# Patient Record
Sex: Female | Born: 1960 | Race: White | Hispanic: No | Marital: Single | Smoking: Never smoker
Health system: Southern US, Community
[De-identification: ages and names within clinical notes are randomized; demographics above are authoritative.]

## PROBLEM LIST (undated history)

## (undated) DIAGNOSIS — K5792 Diverticulitis of intestine, part unspecified, without perforation or abscess without bleeding: Secondary | ICD-10-CM

## (undated) DIAGNOSIS — C801 Malignant (primary) neoplasm, unspecified: Secondary | ICD-10-CM

## (undated) HISTORY — PX: CHOLECYSTECTOMY: SHX55

## (undated) HISTORY — PX: APPENDECTOMY: SHX54

## (undated) HISTORY — PX: TUBAL LIGATION: SHX77

## (undated) HISTORY — PX: ABDOMINAL SURGERY: SHX537

---

## 1998-04-27 ENCOUNTER — Other Ambulatory Visit: Admission: RE | Admit: 1998-04-27 | Discharge: 1998-04-27 | Payer: Self-pay | Admitting: Obstetrics and Gynecology

## 1998-07-19 ENCOUNTER — Ambulatory Visit (HOSPITAL_COMMUNITY): Admission: RE | Admit: 1998-07-19 | Discharge: 1998-07-19 | Payer: Self-pay | Admitting: Infectious Diseases

## 1998-07-19 ENCOUNTER — Encounter: Payer: Self-pay | Admitting: Infectious Diseases

## 1999-06-20 ENCOUNTER — Other Ambulatory Visit: Admission: RE | Admit: 1999-06-20 | Discharge: 1999-06-20 | Payer: Self-pay | Admitting: Obstetrics and Gynecology

## 1999-08-18 ENCOUNTER — Encounter: Admission: RE | Admit: 1999-08-18 | Discharge: 1999-11-16 | Payer: Self-pay | Admitting: *Deleted

## 1999-09-14 ENCOUNTER — Other Ambulatory Visit: Admission: RE | Admit: 1999-09-14 | Discharge: 1999-09-14 | Payer: Self-pay | Admitting: Obstetrics and Gynecology

## 1999-11-03 ENCOUNTER — Encounter: Payer: Self-pay | Admitting: Obstetrics and Gynecology

## 1999-11-03 ENCOUNTER — Encounter: Admission: RE | Admit: 1999-11-03 | Discharge: 1999-11-03 | Payer: Self-pay | Admitting: Obstetrics and Gynecology

## 2000-08-03 ENCOUNTER — Encounter: Admission: RE | Admit: 2000-08-03 | Discharge: 2000-08-03 | Payer: Self-pay | Admitting: Obstetrics and Gynecology

## 2000-08-03 ENCOUNTER — Encounter: Payer: Self-pay | Admitting: Obstetrics and Gynecology

## 2000-09-17 ENCOUNTER — Other Ambulatory Visit: Admission: RE | Admit: 2000-09-17 | Discharge: 2000-09-17 | Payer: Self-pay | Admitting: Obstetrics and Gynecology

## 2001-09-30 ENCOUNTER — Other Ambulatory Visit: Admission: RE | Admit: 2001-09-30 | Discharge: 2001-09-30 | Payer: Self-pay | Admitting: Obstetrics and Gynecology

## 2002-11-13 ENCOUNTER — Other Ambulatory Visit: Admission: RE | Admit: 2002-11-13 | Discharge: 2002-11-13 | Payer: Self-pay | Admitting: Obstetrics and Gynecology

## 2002-12-12 ENCOUNTER — Encounter: Admission: RE | Admit: 2002-12-12 | Discharge: 2002-12-12 | Payer: Self-pay | Admitting: Obstetrics and Gynecology

## 2002-12-12 ENCOUNTER — Encounter: Payer: Self-pay | Admitting: Obstetrics and Gynecology

## 2003-03-21 ENCOUNTER — Emergency Department (HOSPITAL_COMMUNITY): Admission: EM | Admit: 2003-03-21 | Discharge: 2003-03-21 | Payer: Self-pay | Admitting: Emergency Medicine

## 2003-03-21 ENCOUNTER — Encounter: Payer: Self-pay | Admitting: Emergency Medicine

## 2003-03-26 ENCOUNTER — Encounter: Admission: RE | Admit: 2003-03-26 | Discharge: 2003-03-26 | Payer: Self-pay | Admitting: Gastroenterology

## 2003-03-26 ENCOUNTER — Encounter: Payer: Self-pay | Admitting: Gastroenterology

## 2003-12-23 ENCOUNTER — Other Ambulatory Visit: Admission: RE | Admit: 2003-12-23 | Discharge: 2003-12-23 | Payer: Self-pay | Admitting: Obstetrics and Gynecology

## 2004-01-01 ENCOUNTER — Ambulatory Visit (HOSPITAL_COMMUNITY): Admission: RE | Admit: 2004-01-01 | Discharge: 2004-01-01 | Payer: Self-pay | Admitting: Obstetrics and Gynecology

## 2004-01-13 ENCOUNTER — Encounter: Admission: RE | Admit: 2004-01-13 | Discharge: 2004-04-12 | Payer: Self-pay | Admitting: Internal Medicine

## 2004-04-13 ENCOUNTER — Encounter: Admission: RE | Admit: 2004-04-13 | Discharge: 2004-07-12 | Payer: Self-pay | Admitting: Internal Medicine

## 2004-06-14 ENCOUNTER — Ambulatory Visit (HOSPITAL_COMMUNITY): Admission: RE | Admit: 2004-06-14 | Discharge: 2004-06-14 | Payer: Self-pay | Admitting: Internal Medicine

## 2004-08-02 ENCOUNTER — Observation Stay (HOSPITAL_COMMUNITY): Admission: EM | Admit: 2004-08-02 | Discharge: 2004-08-03 | Payer: Self-pay | Admitting: Emergency Medicine

## 2004-08-02 ENCOUNTER — Encounter: Payer: Self-pay | Admitting: Obstetrics and Gynecology

## 2004-08-02 ENCOUNTER — Encounter (INDEPENDENT_AMBULATORY_CARE_PROVIDER_SITE_OTHER): Payer: Self-pay | Admitting: Specialist

## 2005-01-25 ENCOUNTER — Encounter: Admission: RE | Admit: 2005-01-25 | Discharge: 2005-01-25 | Payer: Self-pay | Admitting: Obstetrics and Gynecology

## 2005-05-29 ENCOUNTER — Encounter: Admission: RE | Admit: 2005-05-29 | Discharge: 2005-05-29 | Payer: Self-pay | Admitting: Family Medicine

## 2005-06-02 ENCOUNTER — Encounter: Admission: RE | Admit: 2005-06-02 | Discharge: 2005-06-02 | Payer: Self-pay | Admitting: Gastroenterology

## 2005-06-26 ENCOUNTER — Encounter: Admission: RE | Admit: 2005-06-26 | Discharge: 2005-06-26 | Payer: Self-pay | Admitting: Internal Medicine

## 2005-09-12 ENCOUNTER — Emergency Department (HOSPITAL_COMMUNITY): Admission: EM | Admit: 2005-09-12 | Discharge: 2005-09-12 | Payer: Self-pay | Admitting: Emergency Medicine

## 2005-10-15 ENCOUNTER — Encounter: Admission: RE | Admit: 2005-10-15 | Discharge: 2005-10-15 | Payer: Self-pay | Admitting: Internal Medicine

## 2006-02-02 ENCOUNTER — Encounter: Admission: RE | Admit: 2006-02-02 | Discharge: 2006-02-02 | Payer: Self-pay | Admitting: Internal Medicine

## 2006-02-02 IMAGING — US US ABDOMEN COMPLETE
1 series · 14 of 25 positions shown · non-contrast
Comparison: [REDACTED] abdominal ultrasound [DATE] and [REDACTED] abdominal CT [DATE].

CLINICAL DATA: Right-sided abdominal pain. 
 ABDOMEN ULTRASOUND:
TECHNIQUE: Complete abdominal ultrasound examination was performed including evaluation of the liver, gallbladder, bile ducts, pancreas, kidneys, spleen, IVC, and abdominal aorta.

[Series 1: unknown · 0.38mm/px · 14 of 82 slices shown]
[im 1/82]
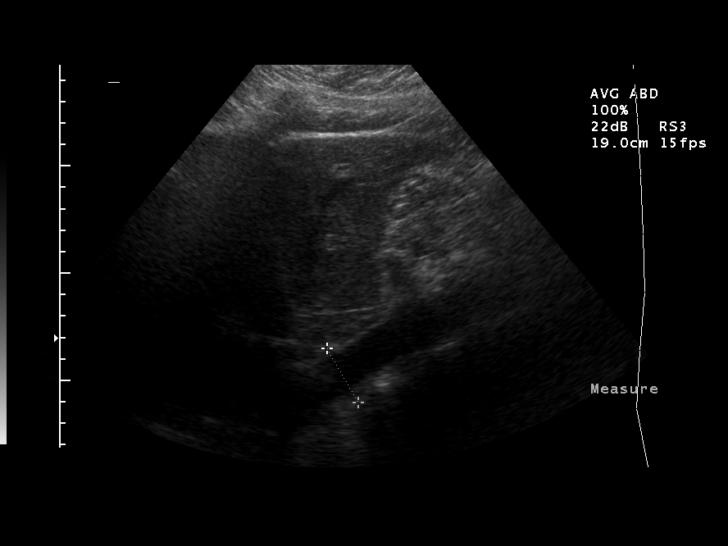
[im 7/82]
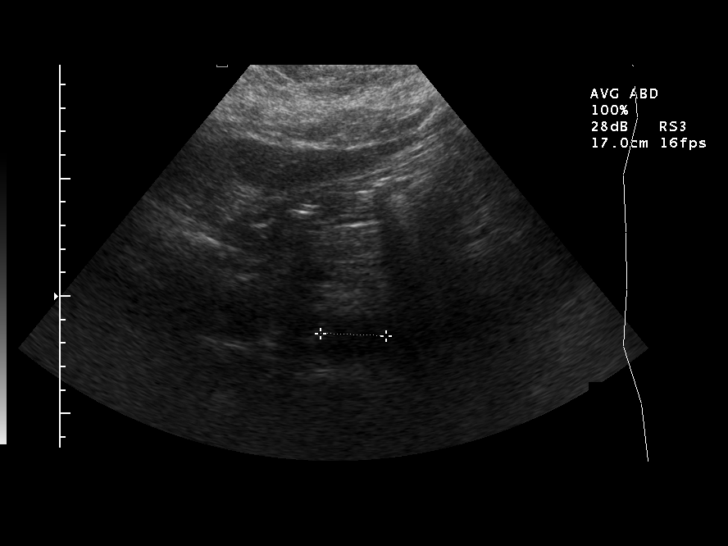
[im 14/82]
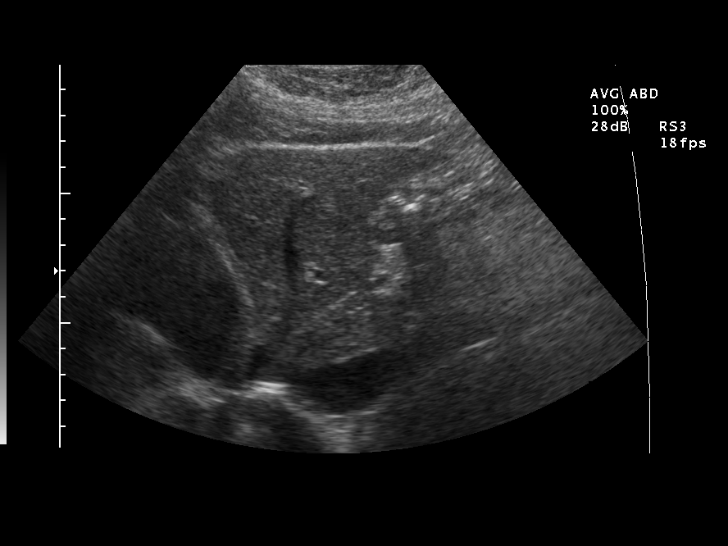
[im 21/82]
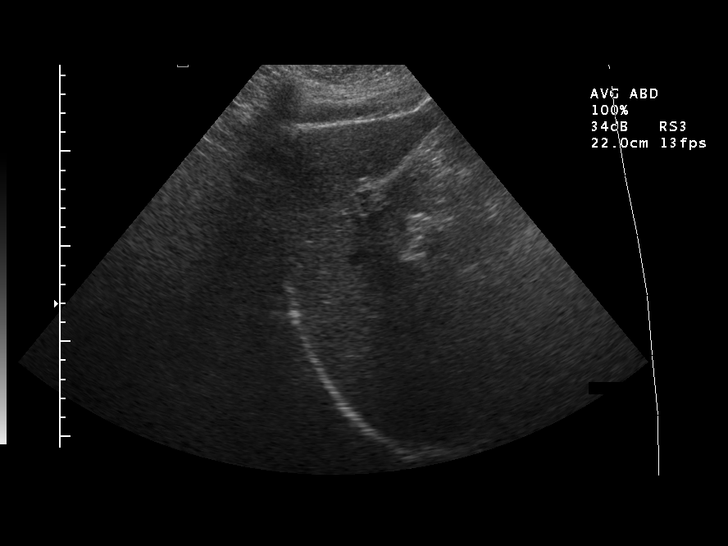
[im 28/82]
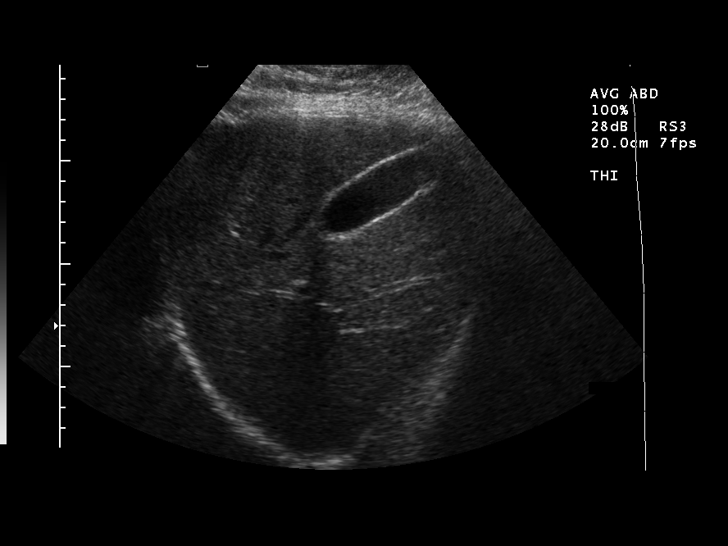
[im 31/82]
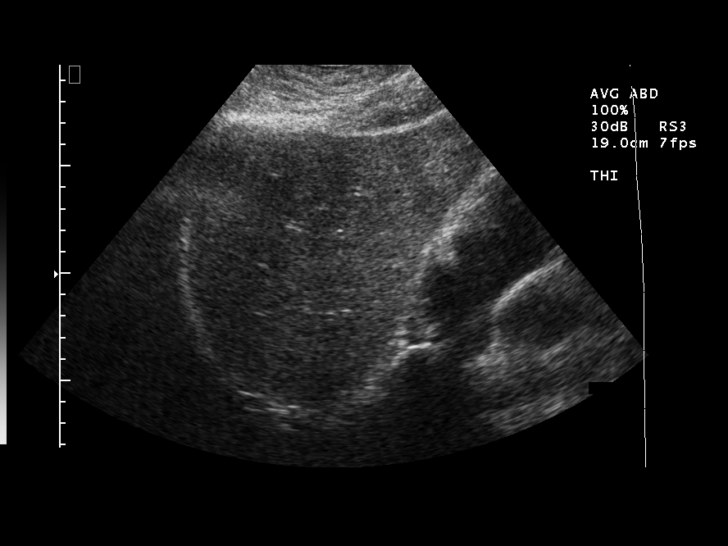
[im 38/82]
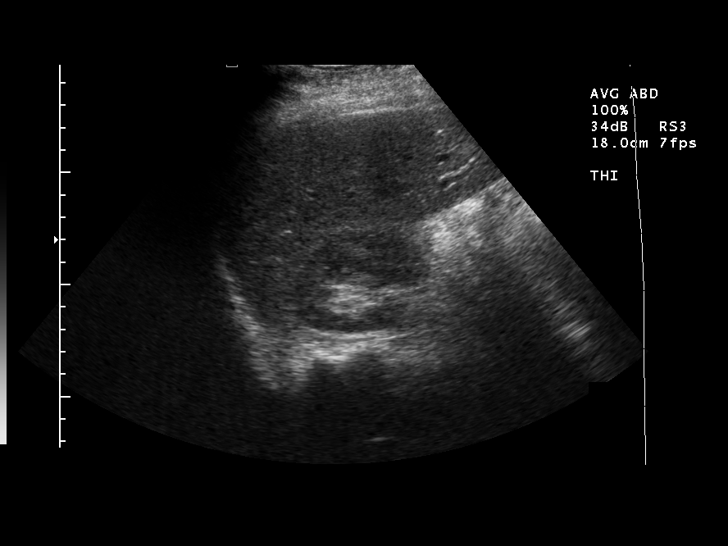
[im 44/82]
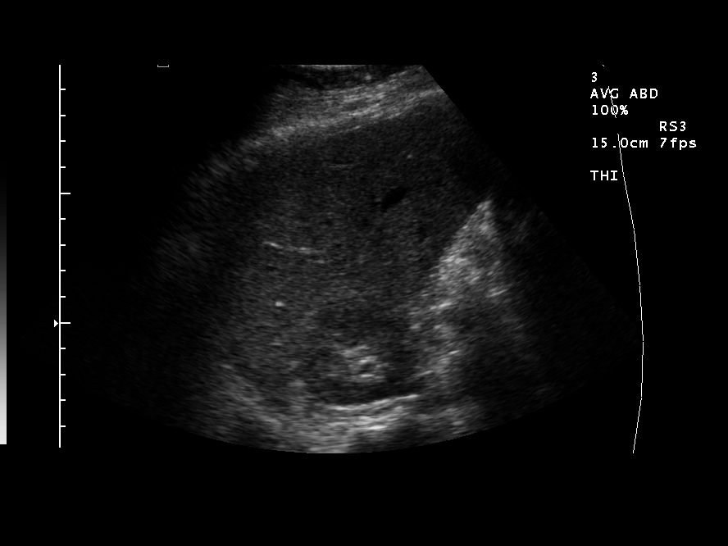
[im 51/82]
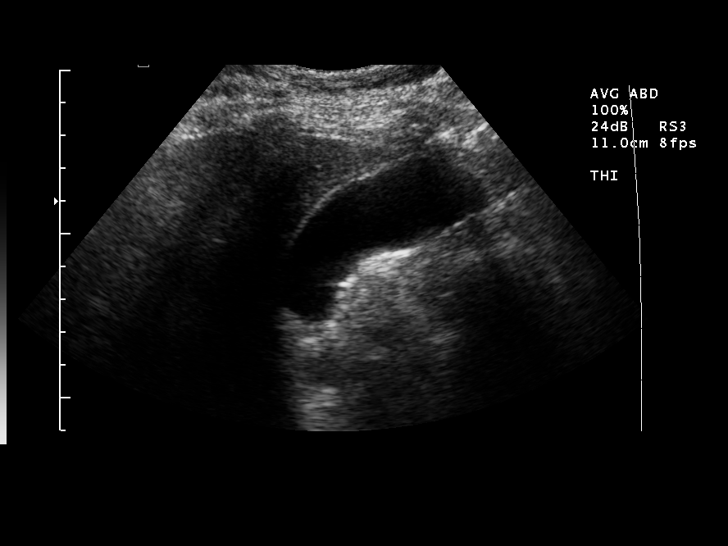
[im 55/82]
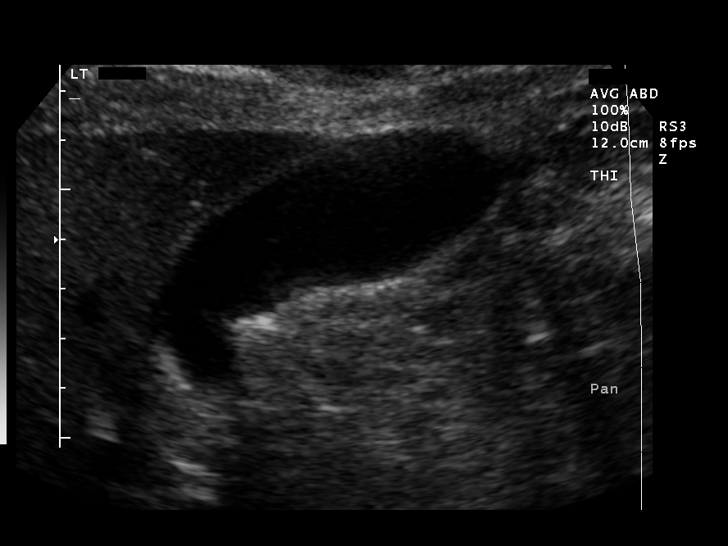
[im 61/82]
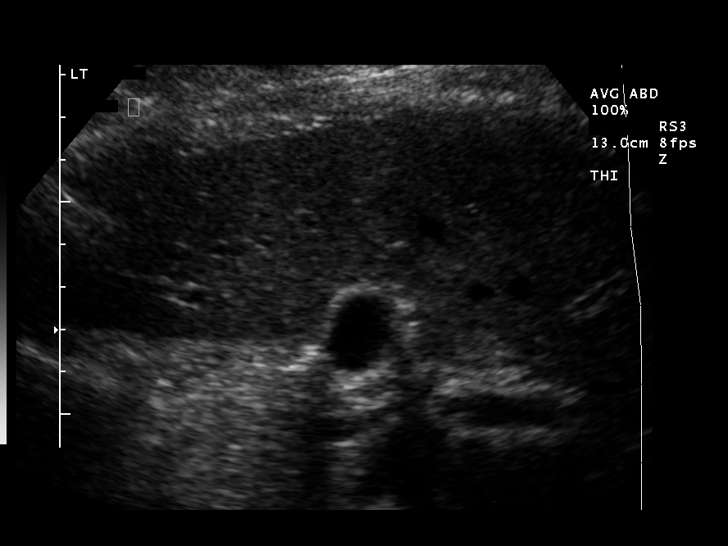
[im 68/82]
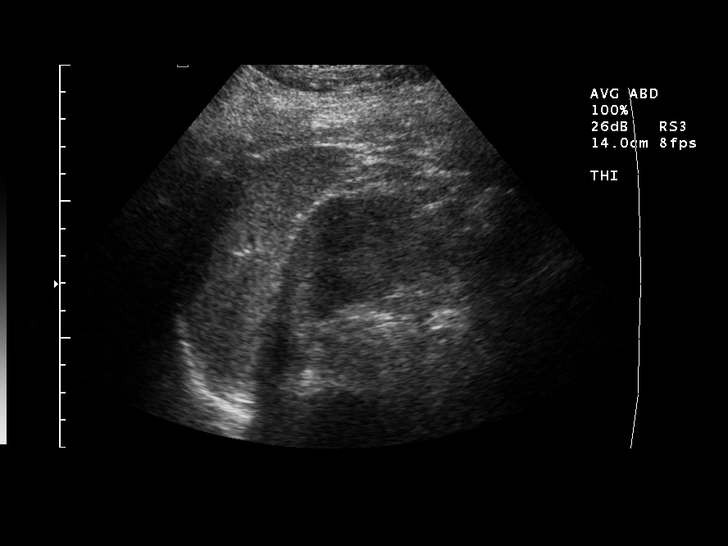
[im 75/82]
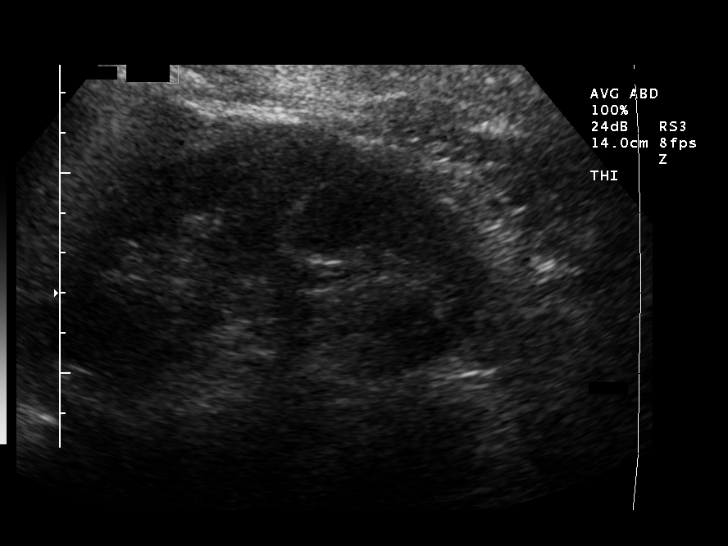
[im 82/82]
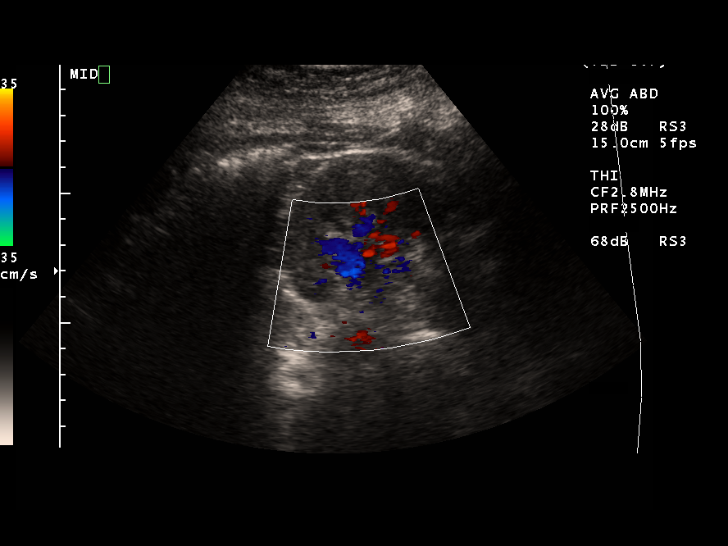

[14 of 25 positions shown; findings below may reference images not displayed]

FINDINGS: There is no evidence of gallstones or biliary ductal dilatation.  The liver is within normal limits in echogenicity, and no focal liver lesions are seen.   The visualized portions of the IVC and pancreas are unremarkable.  Pancreatic assessment is limited due to overlying gastrointestinal gas.  
 There is no evidence of splenomegaly.  The kidneys are unremarkable, and there is no evidence of hydronephrosis.  The abdominal aorta is non-dilated.  Gallbladder wall thickness 2 mm, maximum common bile duct diameter 5 mm, spleen 10 cm long, right kidney 11.7 cm long, left kidney 11.3 cm long, maximum abdominal aortic diameter is 3 cm proximally.
IMPRESSION: 1.  Limited assessment of pancreas.
 2.  Otherwise normal.

## 2006-02-09 ENCOUNTER — Encounter: Admission: RE | Admit: 2006-02-09 | Discharge: 2006-02-09 | Payer: Self-pay | Admitting: Gastroenterology

## 2006-02-14 ENCOUNTER — Ambulatory Visit (HOSPITAL_COMMUNITY): Admission: RE | Admit: 2006-02-14 | Discharge: 2006-02-14 | Payer: Self-pay

## 2006-02-14 ENCOUNTER — Encounter (INDEPENDENT_AMBULATORY_CARE_PROVIDER_SITE_OTHER): Payer: Self-pay | Admitting: Specialist

## 2006-08-01 ENCOUNTER — Encounter: Admission: RE | Admit: 2006-08-01 | Discharge: 2006-08-01 | Payer: Self-pay | Admitting: Internal Medicine

## 2007-03-21 ENCOUNTER — Emergency Department (HOSPITAL_COMMUNITY): Admission: EM | Admit: 2007-03-21 | Discharge: 2007-03-21 | Payer: Self-pay | Admitting: Emergency Medicine

## 2007-04-16 ENCOUNTER — Encounter: Admission: RE | Admit: 2007-04-16 | Discharge: 2007-04-16 | Payer: Self-pay | Admitting: Sports Medicine

## 2007-05-06 ENCOUNTER — Encounter: Admission: RE | Admit: 2007-05-06 | Discharge: 2007-05-06 | Payer: Self-pay | Admitting: Sports Medicine

## 2007-05-20 ENCOUNTER — Encounter: Admission: RE | Admit: 2007-05-20 | Discharge: 2007-05-20 | Payer: Self-pay | Admitting: Sports Medicine

## 2007-08-14 ENCOUNTER — Encounter: Admission: RE | Admit: 2007-08-14 | Discharge: 2007-08-14 | Payer: Self-pay | Admitting: Optometry

## 2007-10-18 ENCOUNTER — Emergency Department (HOSPITAL_COMMUNITY): Admission: EM | Admit: 2007-10-18 | Discharge: 2007-10-18 | Payer: Self-pay | Admitting: *Deleted

## 2007-11-14 ENCOUNTER — Encounter: Admission: RE | Admit: 2007-11-14 | Discharge: 2007-11-14 | Payer: Self-pay | Admitting: Neurosurgery

## 2008-01-03 ENCOUNTER — Emergency Department (HOSPITAL_COMMUNITY): Admission: EM | Admit: 2008-01-03 | Discharge: 2008-01-03 | Payer: Self-pay | Admitting: Family Medicine

## 2008-01-09 ENCOUNTER — Other Ambulatory Visit: Admission: RE | Admit: 2008-01-09 | Discharge: 2008-01-09 | Payer: Self-pay | Admitting: Family Medicine

## 2011-03-17 NOTE — Op Note (Signed)
[name], [name]               ACCOUNT NO.:  [id number]   MEDICAL RECORD NO.:  [id number]          PATIENT TYPE:  AMB   LOCATION:  DAY                          FACILITY:  [facility]   PHYSICIAN:  Lebron Conners, M.D.   DATE OF BIRTH:  Apr 12, 1961   DATE OF PROCEDURE:  02/14/2006  DATE OF DISCHARGE:                                 OPERATIVE REPORT   PREOPERATIVE DIAGNOSIS:  Abdominal pain thought to be due to biliary  dyskinesia.   POSTOPERATIVE DIAGNOSIS:  Abdominal pain thought to be due to biliary  dyskinesia.   OPERATION:  Laparoscopic cholecystectomy with operative cholangiogram.   SURGEON:  Lebron Conners, M.D.   ANESTHESIA:  General.   COMPLICATIONS:  None.   BLOOD LOSS:  Minimal.   SPECIMEN:  Gallbladder.   CONDITION:  To PACU, good.   PROCEDURE:  After the patient was monitored and had general anesthesia and  routine preparation and draping of the abdomen, I infiltrated local  anesthetic just below the umbilicus, then used a previous scar to make a  short transverse incision and dissected down through the scar and fat to the  midline fascia, which I incised for about 2 cm, then bluntly entered the  peritoneal cavity and assured that it was an atraumatic entry.  I placed a 0  Vicryl pursestring suture in the fascia and secured a Hasson cannula and  inflated the abdomen with carbon dioxide.  Laparoscopy disclosed no  abnormalities.  The gallbladder was decompressed and appeared non-inflamed,  although there were a few adhesions to the area of the infundibulum.  All  other viscera appeared normal.  I then anesthetized 3 additional sites and  placed a 10-mm epigastric port and two 5-mm right mid-abdominal ports and  retracted the fundus of the gallbladder toward the right shoulder.  With the  patient head-up, foot-down and tilted to the left, I dissected away the  adhesions and then pulled the infundibulum laterally and dissected out the  cystic duct and the cystic  artery.  I placed 3 clips on the cystic artery  and divided it between the 2 closest to the gallbladder.  I placed 1 clip on  the proximal part of the cystic duct as it emerged from the infundibulum.  I  then made a small hole in the cystic duct and cannulated that with a Cook  cholangiogram catheter, which inserted through a small stab incision.  I  secured that with a clip and then with portable fluoroscope, I performed a  fluoroscopic cholangiogram, which showed a normal-sized ducts with rapid  drainage into the duodenum, no filling defects and no evidence of stricture.  I then resumed laparoscopy and removed the clip and the cholangiogram  catheter, then clipped the distal cystic duct with 2 clips and divided it.  I used the hook dissector with cautery to dissect the gallbladder from the  liver, finding 1 other small structure which I thought might be an accessory  duct going directly into the gallbladder or small vessel and I clipped and  divided that.  I then removed the gallbladder from the abdomen  after  detaching it from the liver, after placing it in a plastic pouch.  I then  irrigated the gallbladder fossa and the right upper quadrant and removed the  irrigant.  I saw no evidence of bleeding or leakage of bile.  Sponge, needle  and instrument counts were correct.  After I had tied  the pursestring suture, I felt was umbilical incision closure was secure.  I  removed the 2 lateral ports under direct view, then allowed the carbon  dioxide to escape and removed the epigastric port.  I closed all the skin  incisions with intracuticular 4-0 Vicryl and Steri-Strips and applied  bandages.      Lebron Conners, M.D.  Electronically Signed     WB/MEDQ  D:  02/14/2006  T:  02/15/2006  Job:  [id number]   cc:   Everardo All. Madilyn Fireman, M.D.  Fax: [phone]   Georgann Housekeeper, MD  Fax: [phone]

## 2011-03-17 NOTE — Op Note (Signed)
NAMEMERRELL, RETTINGER               ACCOUNT NO.:  [id number]   MEDICAL RECORD NO.:  [id number]          PATIENT TYPE:  OBV   LOCATION:  0109                         FACILITY:  [facility]   PHYSICIAN:  Lorre Munroe., M.D.DATE OF BIRTH:  Jan 21, 1961   DATE OF PROCEDURE:  08/02/2004  DATE OF DISCHARGE:                                 OPERATIVE REPORT   PREOPERATIVE DIAGNOSIS:  Acute appendicitis.   POSTOPERATIVE DIAGNOSIS:  Acute appendicitis.   OPERATION:  Laparoscopic appendectomy.   SURGEON:  Lebron Conners, M.D.   ANESTHESIA:  General.   PROCEDURE:  After the patient was monitored, anesthesia, and with Foley  catheter in bladder and routine preparation and draping of the abdomen, I  infiltrated local anesthetic just below the umbilicus, a spot below the  midline and had another spot in the right upper quadrant.  I made a short  transverse incision just below the umbilicus and dissected down to the  midline fascia and opened it longitudinally for about 1.5 cm and then opened  the peritoneum bluntly.  I placed a 0 Vicryl purse-string suture in the  fascia and secured a Hasson cannula and then put in a scope and saw evidence  of inflammation in the right lower quadrant.  I saw a little bit of cloudy  fluid in the pelvis but no other abnormalities.  The liver and gallbladder  and all of the intestines appeared normal except for the area of the cecum.  I put in a 5 mm right upper quadrant port and a 12 mm lower midline port  under direct vision, and it appeared to go in atraumatically.  I then  grasped the cecum with a Glassman clamp and bluntly dissected the acute  adhesions between the inflamed appendix and the cecum and lateral pelvic  wall on the right side until I had a view of the appendix, which appeared to  be acutely inflamed at its mid portion, perhaps frankly gangrenous.  Then I  grasped the appendix with a ratchet grasper and elevated it and dissected  further until I  thinned the mesentery out a good bit and had it separated  from the cecum and distal ileum to the point I could staple across it.  I  stapled the appendix and mesentery with one firing of an endoscopic cutting  stapler and that amputated it nicely.  I then reserved it in a plastic pouch  in the right upper quadrant while I irrigated the operative area and removed  the cloudy fluid from the pelvis.  I then removed the appendix from the  abdomen in the plastic pouch through the umbilical incision and tied the  purse-string suture.  I checked and saw that hemostasis was good.  I removed  the right upper  quadrant port under direct vision and saw no bleeding from the abdominal  wall.  After allowing the CO2 to escape, I removed the lower midline port.  I closed all of the skin incisions with intracuticular 4-0 Vicryl and Steri-  Strips.  Patient tolerated the operation well and left the OR in good  condition.  WB/MEDQ  D:  08/02/2004  T:  08/02/2004  Job:  [id number]   cc:   Richardean Sale, M.D.  [address]  [place]  [place] [address]  Fax: [phone]

## 2011-03-17 NOTE — H&P (Signed)
[name], [name]               ACCOUNT NO.:  [id number]   MEDICAL RECORD NO.:  [id number]          PATIENT TYPE:  OBV   LOCATION:  0109                         FACILITY:  [facility]   PHYSICIAN:  Lorre Munroe., M.D.DATE OF BIRTH:  [birth year]   DATE OF ADMISSION:  08/02/2004  DATE OF DISCHARGE:                                HISTORY & PHYSICAL   CHIEF COMPLAINT:  Abdominal pain.   HISTORY OF PRESENT ILLNESS:  This is a previously healthy 50 year old white  female who has had about a one day history of abdominal upset and subsequent  rather severe right lower quadrant pain. White count was elevated. CT scan  showed evidence of acute appendicitis without rupture. She has not had a  fever, but she has felt a chill. She vomited once. No urinary problems. No  chronic GI problems. Dr. Richardean Sale saw her at [facility] and  referred her for appendectomy.   PAST MEDICAL HISTORY:  The patient is a smoker. She is on Zyrtec. She also  is on Topamax and other medications for nerves. She denies medicine  allergies. No chronic serious medical problems. The only previous operation  was removal of an ingrown toenail as well as a laparoscopic tubal ligation.   The patient drinks alcoholic beverages moderately.   FAMILY HISTORY:  Childhood illnesses unremarkable.   REVIEW OF SYSTEMS:  All together negative.   PHYSICAL EXAMINATION:  VITAL SIGNS: Temperature and vital signs per nursing  staff.  GENERAL: The patient's mental status is normal.  HEENT/NECK: Head and neck exam unremarkable. Ears, nose, and throat  unremarkable.  CHEST: Clear to auscultation.  HEART: Rate and rhythm normal. No murmur or gallop.  BREASTS: Normal.  ABDOMEN: Tender in the right lower quadrant. Otherwise, normal. No hernia or  mass.  RECTAL/PELVIC: Not done.  EXTREMITIES: Normal. No deformity. No edema. Pulses full.  SKIN:  No lesions noted.  LYMPH NODES: Not enlarged.   IMPRESSION:  Acute  appendicitis.   PLAN:  Laparoscopic appendectomy. The patient accepts the risks of the  operation and requests that we proceed.    WB/MEDQ  D:  08/02/2004  T:  08/02/2004  Job:  [id number]

## 2011-08-04 LAB — ACETAMINOPHEN LEVEL: Acetaminophen (Tylenol), Serum: 24

## 2021-09-06 ENCOUNTER — Ambulatory Visit (INDEPENDENT_AMBULATORY_CARE_PROVIDER_SITE_OTHER): Payer: 59 | Admitting: Orthopaedic Surgery

## 2021-09-06 ENCOUNTER — Ambulatory Visit: Payer: Self-pay

## 2021-09-06 VITALS — Ht 64.0 in | Wt 206.0 lb

## 2021-09-06 DIAGNOSIS — M25561 Pain in right knee: Secondary | ICD-10-CM | POA: Diagnosis not present

## 2021-09-06 DIAGNOSIS — G8929 Other chronic pain: Secondary | ICD-10-CM

## 2021-09-06 DIAGNOSIS — M1611 Unilateral primary osteoarthritis, right hip: Secondary | ICD-10-CM

## 2021-09-06 DIAGNOSIS — M25562 Pain in left knee: Secondary | ICD-10-CM

## 2021-09-06 DIAGNOSIS — M1612 Unilateral primary osteoarthritis, left hip: Secondary | ICD-10-CM | POA: Diagnosis not present

## 2021-09-06 NOTE — Progress Notes (Signed)
Office Visit Note   Patient: [name]           Date of Birth: December 28, 1960           MRN: [id number] Visit Date: 09/06/2021              Requested by: No referring provider defined for this encounter. PCP: Tracey Harries, MD   Assessment & Plan: Visit Diagnoses:  1. Chronic pain of left knee   2. Chronic pain of right knee   3. Unilateral primary osteoarthritis, left hip   4. Unilateral primary osteoarthritis, right hip     Plan: I went over the patient's x-rays with her of both knees and went over her treatment plan for knee replacement surgery.  I showed her knee replacement model and discussed in detail what the surgery involves.  We talked about intraoperative and postoperative course and what to expect from a surgical standpoint with risk and benefits of surgery.  She will call us when she would like to have one of her knees scheduled for knee replacement.  A lot of this will depend on what goes on with her GI and pancreas issues.  All questions and concerns were answered and addressed.  Follow-Up Instructions: Return if symptoms worsen or fail to improve.   Orders:  Orders Placed This Encounter  Procedures   XR Knee 1-2 Views Right   XR Knee 1-2 Views Left   No orders of the defined types were placed in this encounter.     Procedures: No procedures performed   Clinical Data: No additional findings.   Subjective: Chief Complaint  Patient presents with   Left Knee - Pain   Right Knee - Pain  The patient is a very pleasant 60 year old female with known well-documented bilateral knee osteoarthritis.  Both knees hurt equally.  They have been hurting for many years now and she is had multiple injections in both knees over time.  We have replaced her mindset so she came to me for considering knee replacement surgery.  Unfortunately she is dealing with pancreatic issues and may have upcoming pancreas surgery.  Her knees both hurt on a daily basis and they are  detriment affecting her mobility, her quality of life and actives daily living.  They can be 10/10 at times.  There is a lot of grinding with both knees.  Again she has had multiple interventions in both knees at this point has exhausted all other conservative treatment measures as it relates to bilateral knee pain.  She is also been dealing with significant nausea lately.  HPI  Review of Systems There is no listed fever or chills  Objective: Vital Signs: Ht 5\' 4"  (1.626 m)   Wt 206 lb (93.4 kg)   BMI 35.36 kg/m   Physical Exam She is alert and orient x3 and in no acute distress Ortho Exam Examination of both knees shows varus malalignment with significant patellofemoral crepitation throughout the arc of motion.  She has significant medial and lateral joint line tenderness.  Both knees are ligamentously stable. Specialty Comments:  No specialty comments available.  Imaging: XR Knee 1-2 Views Left  Result Date: 09/06/2021 2 views left knee show tricompartment arthritic changes with varus malalignment and osteophytes in all 3 compartments.  There is joint space narrowing of the medial compartment and the patellofemoral joint.  XR Knee 1-2 Views Right  Result Date: 09/06/2021 2 views of the right knee show tricompartment arthritis with varus malalignment, medial joint  space narrowing and patellofemoral narrowing as well as osteophytes in all 3 compartments    PMFS History: Patient Active Problem List   Diagnosis Date Noted   Unilateral primary osteoarthritis, left hip 09/06/2021   Unilateral primary osteoarthritis, right hip 09/06/2021   No past medical history on file.  No family history on file.  Social History   Occupational History   Not on file  Tobacco Use   Smoking status: Not on file   Smokeless tobacco: Not on file  Substance and Sexual Activity   Alcohol use: Not on file   Drug use: Not on file   Sexual activity: Not on file

## 2022-08-18 ENCOUNTER — Encounter: Payer: Self-pay | Admitting: Orthopaedic Surgery

## 2023-03-14 ENCOUNTER — Ambulatory Visit: Payer: Medicaid Other | Admitting: Orthopaedic Surgery

## 2024-01-25 ENCOUNTER — Encounter (HOSPITAL_COMMUNITY): Payer: Self-pay

## 2024-01-25 ENCOUNTER — Other Ambulatory Visit: Payer: Self-pay

## 2024-01-25 ENCOUNTER — Emergency Department (HOSPITAL_COMMUNITY): Admission: EM | Admit: 2024-01-25 | Discharge: 2024-01-25 | Disposition: A | Discharge status: Home / Self Care

## 2024-01-25 DIAGNOSIS — W19XXXA Unspecified fall, initial encounter: Secondary | ICD-10-CM

## 2024-01-25 DIAGNOSIS — S01112A Laceration without foreign body of left eyelid and periocular area, initial encounter: Secondary | ICD-10-CM | POA: Diagnosis not present

## 2024-01-25 DIAGNOSIS — S0181XA Laceration without foreign body of other part of head, initial encounter: Secondary | ICD-10-CM

## 2024-01-25 DIAGNOSIS — W01198A Fall on same level from slipping, tripping and stumbling with subsequent striking against other object, initial encounter: Secondary | ICD-10-CM | POA: Diagnosis not present

## 2024-01-25 DIAGNOSIS — S0592XA Unspecified injury of left eye and orbit, initial encounter: Secondary | ICD-10-CM | POA: Diagnosis present

## 2024-01-25 HISTORY — DX: Malignant (primary) neoplasm, unspecified: C80.1

## 2024-01-25 HISTORY — DX: Diverticulitis of intestine, part unspecified, without perforation or abscess without bleeding: K57.92

## 2024-01-25 NOTE — Discharge Instructions (Signed)
 You may shower per usual.  Do not submerge or soak underwater.  The Steri-Strips and glue will fall off on its own.  Please follow-up with your primary doctor.  Return immediately if develop fevers, chills, sudden onset headache, vision changes, painful eye movements, wound becomes red, appears infected, drains pus or you develop any new or worsening symptoms.

## 2024-01-25 NOTE — ED Provider Notes (Signed)
 [place] EMERGENCY DEPARTMENT AT [facility] Provider Note   CSN: [id number] Arrival date & time: 01/25/24  4098     History  Chief Complaint  Patient presents with   Marletta Lor    [name] is a 63 y.o. female.  63 year old female presenting emergency department after mechanical fall.  Tripped on uneven sidewalk.  She did strike her head, small laceration to the left upper eyebrow.  Does not take blood thinner.  No other injuries.  No neck pain.  No nausea no vomiting.   Fall       Home Medications Prior to Admission medications   Not on File      Allergies    Patient has no known allergies.    Review of Systems   Review of Systems  Physical Exam Updated Vital Signs BP 127/78   Pulse 79   Temp (!) 97.5 F (36.4 C) (Oral)   Resp 14   Ht 5\' 4"  (1.626 m)   Wt 82.1 kg   SpO2 97%   BMI 31.07 kg/m  Physical Exam Vitals and nursing note reviewed.  Constitutional:      General: She is not in acute distress.    Appearance: She is not toxic-appearing.  HENT:     Head: Normocephalic.     Comments: Small 1 cm laceration just above the left outer eyebrow.  No foreign bodies.    Nose: Nose normal.     Mouth/Throat:     Mouth: Mucous membranes are moist.  Eyes:     Extraocular Movements: Extraocular movements intact.     Conjunctiva/sclera: Conjunctivae normal.     Pupils: Pupils are equal, round, and reactive to light.  Cardiovascular:     Rate and Rhythm: Normal rate and regular rhythm.     Pulses: Normal pulses.  Pulmonary:     Effort: Pulmonary effort is normal.  Abdominal:     General: Abdomen is flat.     Tenderness: There is no abdominal tenderness.  Musculoskeletal:        General: No tenderness. Normal range of motion.     Cervical back: Normal range of motion and neck supple. No tenderness.  Skin:    General: Skin is warm.     Capillary Refill: Capillary refill takes less than 2 seconds.  Neurological:     Mental Status: She is  alert and oriented to person, place, and time.  Psychiatric:        Mood and Affect: Mood normal.        Behavior: Behavior normal.     ED Results / Procedures / Treatments   Labs (all labs ordered are listed, but only abnormal results are displayed) Labs Reviewed - No data to display  EKG None  Radiology No results found.  Procedures Procedures    Medications Ordered in ED Medications - No data to display  ED Course/ Medical Decision Making/ A&P                                 Medical Decision Making This is a well-appearing 63 year old female presenting emergency department after a mechanical fall.  Not on blood thinners.  Vital signs reassuring.  Does not meet imaging criteria based on [place] CT head/C-spine rules.  Has a small laceration that approximates well to the upper left eye brow.  Cleaned, approximated with Dermabond and steristrips. Had her tetanus shot this past February.  Stable for discharge.  Amount and/or Complexity of Data Reviewed External Data Reviewed:     Details: No record of blood thinners. Labs:     Details: Mechanical fall, labs unlikely to change management or disposition.  Low suspicion for acute metabolic or traumatic injuries.  Will forego labs. Radiology:     Details: Considered CT head/C-spine however well-appearing with no indications based on [place] CT head rules.  Risk Decision regarding hospitalization.         Final Clinical Impression(s) / ED Diagnoses Final diagnoses:  None    Rx / DC Orders ED Discharge Orders     None         Coral Spikes, DO 01/25/24 9811

## 2024-01-25 NOTE — ED Triage Notes (Signed)
 Pt states she tripped over uneven sidewalk and fell. Pt has abrasion to left forehead, denies LOC, denies taking blood thinners.
